# Patient Record
Sex: Female | Born: 1940 | Race: White | Hispanic: No | Marital: Married | Smoking: Never smoker
Health system: Southern US, Community
[De-identification: ages and names within clinical notes are randomized; demographics above are authoritative.]

## PROBLEM LIST (undated history)

## (undated) DIAGNOSIS — I739 Peripheral vascular disease, unspecified: Secondary | ICD-10-CM

## (undated) DIAGNOSIS — C801 Malignant (primary) neoplasm, unspecified: Secondary | ICD-10-CM

## (undated) DIAGNOSIS — E785 Hyperlipidemia, unspecified: Secondary | ICD-10-CM

## (undated) DIAGNOSIS — I639 Cerebral infarction, unspecified: Secondary | ICD-10-CM

## (undated) DIAGNOSIS — Z9889 Other specified postprocedural states: Secondary | ICD-10-CM

## (undated) DIAGNOSIS — I972 Postmastectomy lymphedema syndrome: Secondary | ICD-10-CM

## (undated) DIAGNOSIS — M1990 Unspecified osteoarthritis, unspecified site: Secondary | ICD-10-CM

## (undated) DIAGNOSIS — I10 Essential (primary) hypertension: Secondary | ICD-10-CM

## (undated) HISTORY — DX: Essential (primary) hypertension: I10

## (undated) HISTORY — DX: Cerebral infarction, unspecified: I63.9

## (undated) HISTORY — DX: Unspecified osteoarthritis, unspecified site: M19.90

## (undated) HISTORY — DX: Postmastectomy lymphedema syndrome: I97.2

## (undated) HISTORY — DX: Hyperlipidemia, unspecified: E78.5

## (undated) HISTORY — DX: Malignant (primary) neoplasm, unspecified: C80.1

## (undated) HISTORY — DX: Other specified postprocedural states: Z98.890

## (undated) HISTORY — DX: Peripheral vascular disease, unspecified: I73.9

---

## 2002-11-04 HISTORY — PX: CAROTID ENDARTERECTOMY: SUR193

## 2003-06-20 ENCOUNTER — Ambulatory Visit (HOSPITAL_COMMUNITY): Admission: RE | Admit: 2003-06-20 | Discharge: 2003-06-20 | Payer: Self-pay | Admitting: Cardiology

## 2003-06-24 ENCOUNTER — Ambulatory Visit (HOSPITAL_COMMUNITY): Admission: RE | Admit: 2003-06-24 | Discharge: 2003-06-24 | Payer: Self-pay | Admitting: Cardiology

## 2003-07-18 ENCOUNTER — Encounter (INDEPENDENT_AMBULATORY_CARE_PROVIDER_SITE_OTHER): Payer: Self-pay | Admitting: *Deleted

## 2003-07-18 ENCOUNTER — Inpatient Hospital Stay (HOSPITAL_COMMUNITY): Admission: RE | Admit: 2003-07-18 | Discharge: 2003-07-20 | Payer: Self-pay | Admitting: Vascular Surgery

## 2004-02-15 ENCOUNTER — Encounter: Admission: RE | Admit: 2004-02-15 | Discharge: 2004-02-15 | Payer: Self-pay | Admitting: Family Medicine

## 2004-02-17 ENCOUNTER — Encounter: Admission: RE | Admit: 2004-02-17 | Discharge: 2004-02-17 | Payer: Self-pay | Admitting: Family Medicine

## 2004-09-18 ENCOUNTER — Ambulatory Visit (HOSPITAL_COMMUNITY): Admission: RE | Admit: 2004-09-18 | Discharge: 2004-09-18 | Payer: Self-pay | Admitting: Gastroenterology

## 2004-09-26 ENCOUNTER — Encounter: Admission: RE | Admit: 2004-09-26 | Discharge: 2004-09-26 | Payer: Self-pay | Admitting: Family Medicine

## 2004-11-08 ENCOUNTER — Encounter: Admission: RE | Admit: 2004-11-08 | Discharge: 2004-11-08 | Payer: Self-pay | Admitting: Vascular Surgery

## 2006-06-30 ENCOUNTER — Encounter: Admission: RE | Admit: 2006-06-30 | Discharge: 2006-06-30 | Payer: Self-pay | Admitting: Family Medicine

## 2006-11-04 DIAGNOSIS — C801 Malignant (primary) neoplasm, unspecified: Secondary | ICD-10-CM

## 2006-11-04 HISTORY — DX: Malignant (primary) neoplasm, unspecified: C80.1

## 2006-12-12 ENCOUNTER — Ambulatory Visit: Payer: Self-pay | Admitting: Vascular Surgery

## 2007-07-14 ENCOUNTER — Encounter: Admission: RE | Admit: 2007-07-14 | Discharge: 2007-07-14 | Payer: Self-pay | Admitting: Family Medicine

## 2007-07-22 ENCOUNTER — Encounter: Admission: RE | Admit: 2007-07-22 | Discharge: 2007-07-22 | Payer: Self-pay | Admitting: Family Medicine

## 2007-07-29 ENCOUNTER — Encounter: Admission: RE | Admit: 2007-07-29 | Discharge: 2007-07-29 | Payer: Self-pay | Admitting: Family Medicine

## 2007-07-29 ENCOUNTER — Encounter (INDEPENDENT_AMBULATORY_CARE_PROVIDER_SITE_OTHER): Payer: Self-pay | Admitting: Radiology

## 2007-07-30 ENCOUNTER — Encounter (INDEPENDENT_AMBULATORY_CARE_PROVIDER_SITE_OTHER): Payer: Self-pay | Admitting: Radiology

## 2007-08-07 ENCOUNTER — Encounter: Admission: RE | Admit: 2007-08-07 | Discharge: 2007-08-07 | Payer: Self-pay | Admitting: Surgery

## 2007-08-11 ENCOUNTER — Ambulatory Visit (HOSPITAL_BASED_OUTPATIENT_CLINIC_OR_DEPARTMENT_OTHER): Admission: RE | Admit: 2007-08-11 | Discharge: 2007-08-12 | Payer: Self-pay | Admitting: Surgery

## 2007-08-11 ENCOUNTER — Encounter (INDEPENDENT_AMBULATORY_CARE_PROVIDER_SITE_OTHER): Payer: Self-pay | Admitting: Surgery

## 2007-08-24 ENCOUNTER — Ambulatory Visit: Payer: Self-pay | Admitting: Oncology

## 2007-09-02 LAB — CBC WITH DIFFERENTIAL/PLATELET
Basophils Absolute: 0 10*3/uL (ref 0.0–0.1)
Eosinophils Absolute: 0.2 10*3/uL (ref 0.0–0.5)
HCT: 32.9 % — ABNORMAL LOW (ref 34.8–46.6)
HGB: 11.7 g/dL (ref 11.6–15.9)
LYMPH%: 9.4 % — ABNORMAL LOW (ref 14.0–48.0)
MONO#: 0.5 10*3/uL (ref 0.1–0.9)
NEUT#: 5.7 10*3/uL (ref 1.5–6.5)
NEUT%: 79.7 % — ABNORMAL HIGH (ref 39.6–76.8)
Platelets: 252 10*3/uL (ref 145–400)
RBC: 3.78 10*6/uL (ref 3.70–5.32)
WBC: 7.2 10*3/uL (ref 3.9–10.0)

## 2007-09-06 LAB — LACTATE DEHYDROGENASE: LDH: 171 U/L (ref 94–250)

## 2007-09-06 LAB — CANCER ANTIGEN 27.29: CA 27.29: 24 U/mL (ref 0–39)

## 2007-09-06 LAB — COMPREHENSIVE METABOLIC PANEL
CO2: 26 mEq/L (ref 19–32)
Glucose, Bld: 90 mg/dL (ref 70–99)
Sodium: 142 mEq/L (ref 135–145)
Total Bilirubin: 0.4 mg/dL (ref 0.3–1.2)
Total Protein: 6.8 g/dL (ref 6.0–8.3)

## 2007-09-09 ENCOUNTER — Ambulatory Visit (HOSPITAL_COMMUNITY): Admission: RE | Admit: 2007-09-09 | Discharge: 2007-09-09 | Payer: Self-pay | Admitting: Oncology

## 2007-09-11 ENCOUNTER — Ambulatory Visit (HOSPITAL_COMMUNITY): Admission: RE | Admit: 2007-09-11 | Discharge: 2007-09-11 | Payer: Self-pay | Admitting: Oncology

## 2007-11-05 HISTORY — PX: CHOLECYSTECTOMY: SHX55

## 2008-03-16 ENCOUNTER — Ambulatory Visit: Payer: Self-pay | Admitting: Vascular Surgery

## 2008-07-26 IMAGING — PT NM PET TUM IMG SKULL BASE T - THIGH
6 series · 25 of 25 positions shown · non-contrast
Comparison: No prior PET-CT. Diagnostic CT chest abdomen and pelvis performed
earlier today correlated.

CLINICAL DATA: New diagnosis right breast cancer, status post mastectomy.
Initial staging outside the axilla.

FDG PET-CT TUMOR IMAGING (SKULL BASE TO THIGHS)  09/11/2007:
Fasting Blood Glucose:  92
TECHNIQUE: 16.7 mCi F-18 FDG was injected intravenously via the left hand vein 
Full-ring PET imaging was performed from the skull base through the mid-thighs
65 minutes after injection.  CT data was obtained and used for attenuation
correction and anatomic localization only.

[Series 1: pet ac · axial · 3.3mm · 4.69mm/px · z∈[-870,+0]mm · 5 of 267 slices shown]
[im 1/267]
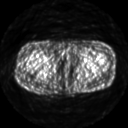
[im 67/267]
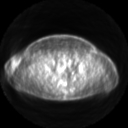
[im 134/267]
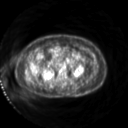
[im 200/267]
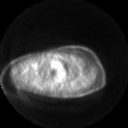
[im 267/267]
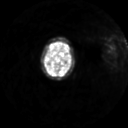

[Series 2: ct images · axial · 3.8mm · 0.98mm/px · z∈[-867,+0]mm · 6 of 264 slices shown]
[im 1/264]
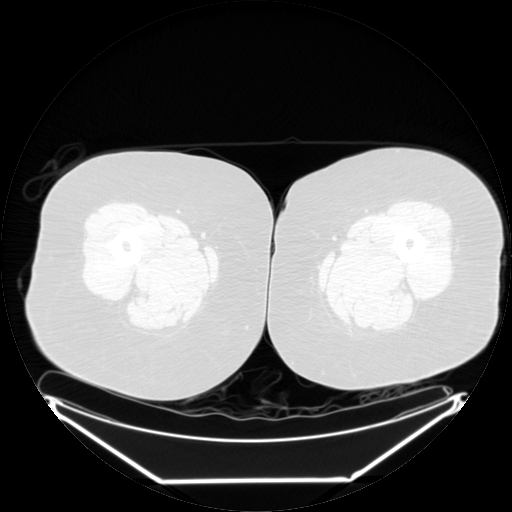
[im 53/264]
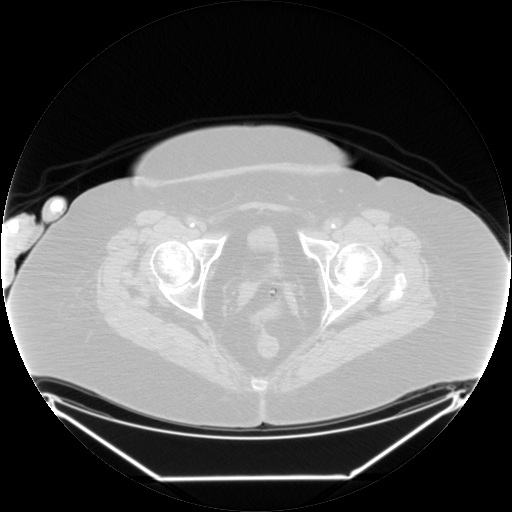
[im 106/264]
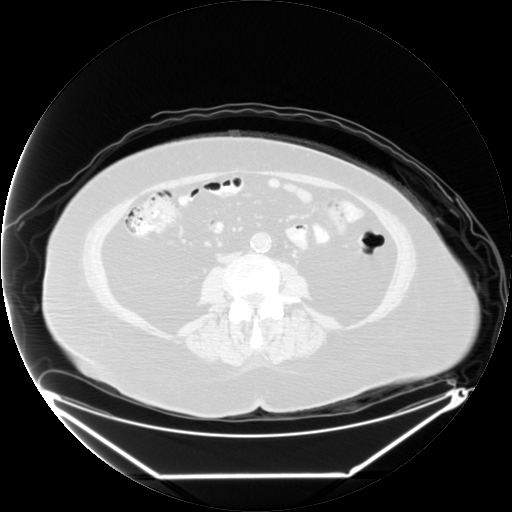
[im 158/264]
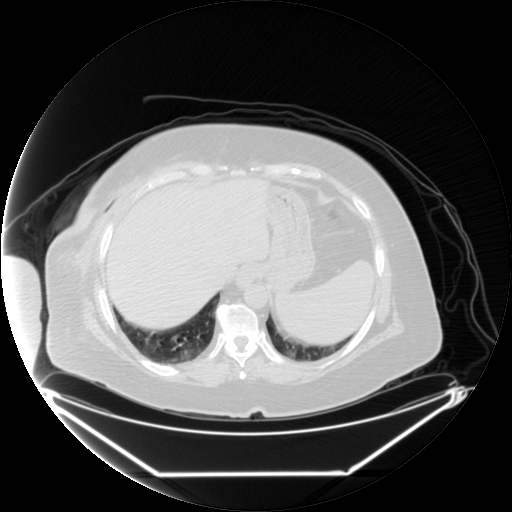
[im 211/264]
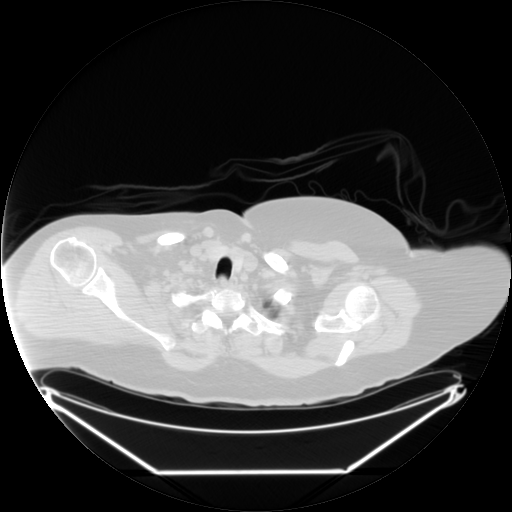
[im 264/264  brain]
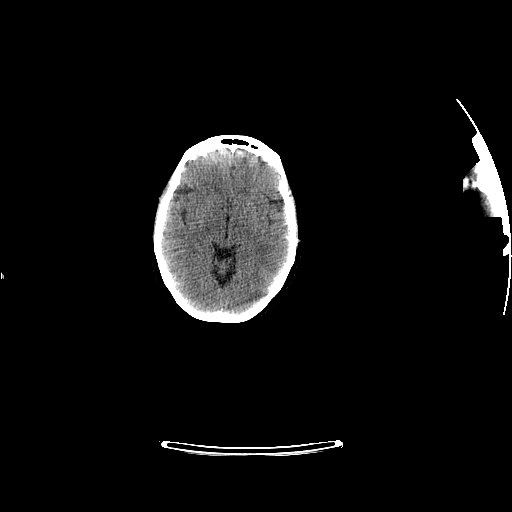

[Series 2: pet nac · axial · 3.3mm · 4.69mm/px · z∈[-870,+0]mm · 6 of 267 slices shown]
[im 1/267]
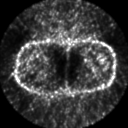
[im 54/267]
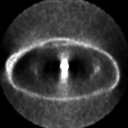
[im 107/267]
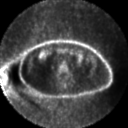
[im 160/267]
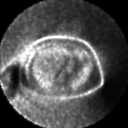
[im 213/267]
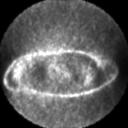
[im 267/267]
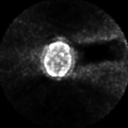

[Series 123: mip · coronal · 3.3mm · 4.69mm/px · 1 of 30 slices shown]
[im 1/30]
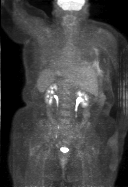

[Series 151: reformatted · axial · 3.3mm · 3.91mm/px · z∈[-857,-13]mm · 6 of 257 slices shown (1 of 2)]
[im 1/257]
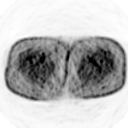
[im 52/257]
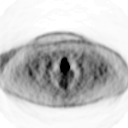
[im 103/257]
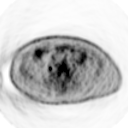
[im 154/257]
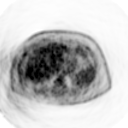
[im 205/257]
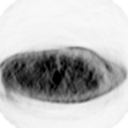
[im 257/257]
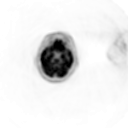

[Series 153: reformatted · coronal · 4.7mm · 6.98mm/px · 1 of 62 slices shown (2 of 2)]
[im 1/62]
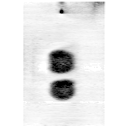

[25 of 25 positions shown; findings below may reference images not displayed]

FINDINGS: Moderate hypermetabolic activity within the right chest wall at the
site of the mastectomy, consistent with post-surgical inflammatory
change/healing. Mild post-surgical activity in the right axilla. No abnormal
metabolic activity in the neck, chest, abdomen or pelvis to suggest metastatic
disease. Normal excretion into the urinary tract, with ureteral activity noted
in both sides of the retroperitoneum. Normal bowel activity also noted.
IMPRESSION: 1. No evidence of metastatic disease in the neck, chest, abdomen or pelvis.
2. Post-surgical activity at the surgical site in the right chest wall and in
the right axilla.

## 2009-03-22 ENCOUNTER — Ambulatory Visit: Payer: Self-pay | Admitting: Vascular Surgery

## 2010-03-21 ENCOUNTER — Ambulatory Visit: Payer: Self-pay | Admitting: Vascular Surgery

## 2010-11-25 ENCOUNTER — Encounter: Payer: Self-pay | Admitting: Family Medicine

## 2011-03-19 NOTE — Op Note (Signed)
[name], [name]               ACCOUNT NO.:  [id number]   MEDICAL RECORD NO.:  [id number]          PATIENT TYPE:  AMB   LOCATION:  DSC                          FACILITY:  [facility]   PHYSICIAN:  Thomas A. Cornett, M.D.DATE OF BIRTH:  18-Jul-1941   DATE OF PROCEDURE:  08/11/2007  DATE OF DISCHARGE:                               OPERATIVE REPORT   PREOPERATIVE DIAGNOSIS:  Right breast cancer.   POSTOPERATIVE DIAGNOSIS:  Right breast cancer.   PROCEDURE:  1. Right simple mastectomy converted to right modified radical      mastectomy.  2. Right sentinel lymph node mapping with a positive sentinel lymph      node with right axillary node dissection after injection of      methylene blue dye.   SURGEON:  [name], M.D.   ASSISTANTSharlet Salina T. Hoxworth, M.D.   ANESTHESIA:  LMA with 10 mL of 0.25% Sensorcaine.   SPECIMEN:  Right breast and right axillary contents to pathology.  Sentinel nodes were positive by touch prep.   DRAINS:  Two 19 Blake drains.   INDICATIONS FOR PROCEDURE:  The patient is a 70 year old post menopausal  female found to have a large area in her right breast.  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  MRI showed a very large 10 cm region which was  probably a combination of both.  By discussion, she does not wish to  undergo any breast conserving measures and wished to have a mastectomy  at this point.  She is brought to the operating today for that after  informed consent has been discussed and other options with her, as well,  preoperatively.  She underwent injection of technetium sulfur colloid  preoperatively in the right breast.   DESCRIPTION OF PROCEDURE:  The patient was brought to the operating room  after injection of the technetium sulfur colloid in the right breast by  radiology.  After induction of LMA anesthesia, the right breast and  right axilla were prepped and  draped in a sterile fashion.  A  curvilinear incision was made above and below the nipple.  Superior skin  flap was raised all the way to the clavicle.  We then used a NeoProbe  and we were able to dissect in the axilla and find two sentinel nodes  which were both blue and hot.  These came back to be positive by touch  prep.  After the superior flap was fashioned, the inferior flap was then  created taking it just below the inferior mammary fold.  After both  flaps were constructed, the breast was removed in a medial to lateral  fashion.  Once we got to the lateral border of the pectoralis major, we  entered the axilla.  We then used clips to control small vessels and  lymphatics.  The axillary vein was identified.  The large draining vein  from the axillary contents to the axillary vein was controlled with two  clips.  I found the thoracodorsal trunk and the long  thoracic nerves  respectively and preserved these.  The median pectoral nerves were also  identified and preserved.  The contents were then removed with the right  mastectomy specimen in situ.  The specimen was oriented and sent to  pathology for evaluation.   Hemostasis was achieved.  I secured the flaps in the chest wall using 3-  0 Vicryl.  Through two separate stab incisions, 19 Blake drains were  placed, one up under the flaps and the other in the right axilla.  I  used small piece of Surgicel around the axillary vein to control some  oozing with excellent result.  After the flaps were secured to the chest  wall, I approximated the skin with 3-0 Vicryl.  4-0 Monocryl was then  used to close the skin in a subcuticular fashion.  Dermabond was applied  to the incision.  The suction balls were suctioned down without leak.  3-  0 nylon kept the drains in place.  Drain sponges were then placed.  All  final counts of sponge, needle and instruments were found to be correct  for this portion of the case.  The patient was awakened  and taken to  recovery in satisfactory condition.  4 mL of methylene blue dye were  injected in the subareolar position after sterile prep and drape for  sentinel lymph node mapping.  All final counts were counted a second  time and found to be correct.  The patient was awakened and taken to  recovery in satisfactory condition.      Thomas A. Cornett, M.D.  Electronically Signed     TAC/MEDQ  D:  08/11/2007  T:  08/11/2007  Job:  [id number]   cc:   Pam Drown, M.D.

## 2011-03-19 NOTE — Procedures (Signed)
CAROTID DUPLEX EXAM   INDICATION:  Carotid artery disease.   HISTORY:  Diabetes:  No.  Cardiac:  No.  Hypertension:  Yes.  Smoking:  No.  Previous Surgery:  Left CEA on 07/18/2003.  CV History:  History of a TIA.  Amaurosis Fugax No, Paresthesias No, Hemiparesis No.                                       RIGHT             LEFT  Brachial systolic pressure:         Mastectomy        Mastectomy  Brachial Doppler waveforms:         Triphasic         Triphasic  Vertebral direction of flow:        Ante              Ante  DUPLEX VELOCITIES (cm/sec)  CCA peak systolic                   98                113  ECA peak systolic                   86                89  ICA peak systolic                   101               127  ICA end diastolic                   42                27  PLAQUE MORPHOLOGY:                  Mixed             Mixed  PLAQUE AMOUNT:                      Mild              Mild  PLAQUE LOCATION:                    ICA               ICA   IMPRESSION:  1. 1% to 39% stenosis of the right internal carotid.  2. Patent left carotid endarterectomy site with Doppler velocities      suggestive of 40% to 59% stenosis of the left internal carotid      artery.  3. Bilateral antegrade flow in the vertebral arteries.       ___________________________________________  Janetta Hora Fields, MD   NT/MEDQ  D:  03/21/2010  T:  03/21/2010  Job:  [id number]

## 2011-03-19 NOTE — Procedures (Signed)
CAROTID DUPLEX EXAM   INDICATION:  Follow-up evaluation of known carotid artery disease.   HISTORY:  Diabetes:  No.  Cardiac:  No.  Hypertension:  Yes.  Smoking:  No.  Previous Surgery:  Left carotid endarterectomy with Dacron patch  angioplasty on 07/18/03 by Dr. Darrick Penna.  CV History:  Patient reports no cerebrovascular symptoms at this time.  Previous duplex performed on 12/12/06 revealed 20-39% ICA stenosis  bilaterally.  Amaurosis Fugax No, Paresthesias No, Hemiparesis No                                       RIGHT             LEFT  Brachial systolic pressure:         Not obtained      Not obtained  Brachial Doppler waveforms:         Triphasic         Triphasic  Vertebral direction of flow:        Antegrade         Antegrade  DUPLEX VELOCITIES (cm/sec)  CCA peak systolic                   125               109  ECA peak systolic                   153               149  ICA peak systolic                   101               124  ICA end diastolic                   16                39  PLAQUE MORPHOLOGY:                  Soft              Soft  PLAQUE AMOUNT:                      Mild              Mild  PLAQUE LOCATION:                    Proximal ICA      Proximal ICA   IMPRESSION:  1. Brachial pressures were not obtained today secondary to recent      mastectomy.  2. 20-39% right internal carotid artery stenosis.  3. 20-39% left internal carotid artery stenosis, status post      endarterectomy.  4. No significant change since previous study performed on 12/12/06.   ___________________________________________  Janetta Hora. Fields, MD   MC/MEDQ  D:  03/16/2008  T:  03/16/2008  Job:  [id number]

## 2011-03-22 NOTE — Cardiovascular Report (Signed)
[name], [name]                           ACCOUNT NO.:  [id number]   MEDICAL RECORD NO.:  [id number]                   PATIENT TYPE:  OIB   LOCATION:  2899                                 FACILITY:  [facility]   PHYSICIAN:  Armanda Magic, M.D.                  DATE OF BIRTH:  Mar 24, 1941   DATE OF PROCEDURE:  06/20/2003  DATE OF DISCHARGE:                              CARDIAC CATHETERIZATION   REFERRING PHYSICIAN:  Pam Drown, M.D.   PROCEDURES:  1. Left heart catheterization.  2. Coronary angiography.  3. Left ventriculography.  4. Intracoronary nitroglycerin injection of the right coronary artery.   OPERATOR:  Armanda Magic, M.D.   INDICATIONS:  Neck pain and abnormal Cardiolite.   COMPLICATIONS:  None.   INTRAVENOUS ACCESS:  Via right femoral artery, 6 French sheath.   This is a pleasant 70 year old obese white female who recently lost 15  pounds and started developing episodes of hypotension.  During her episodes  of hypotension she would develop severe left neck pain going up into her  jaw, radiation to her left shoulder.  She presented to my office with  nonspecific ST abnormalities in the inferior leads.  Stress Cardiolite study  showed an apical perfusion defect with some decreased perfusion in the  inferior wall with stress with redistribution on resting images, consistent  with possible underlying ischemia, normal LV systolic function.   The patient is brought to the cardiac catheterization laboratory in the  fasting, nonsedated state.  Informed consent was obtained.  The patient was  connected to continuous heart rate and pulse oximetry monitoring,  intermittent blood pressure monitoring.  The right groin was prepped and  draped in a sterile fashion.  Xylocaine 1% was used for local anesthesia.  Using modified Seldinger technique a 6 French sheath was placed in the right  femoral artery.  Under fluoroscopic guidance a 6 [place] JL4 catheter was  placed in the  left coronary artery.  Multiple cine films were taken in the  30 degree RAO and 40 degree LAO views.  This catheter was then exchanged out  over a guidewire for a 6 [place] JR4 catheter, which was placed under  fluoroscopic guidance into the right coronary artery.  There was damping  noted on the pressure tracing on engagement of the right coronary ostium.  Cine films were taken in the 40 degree LAO view with a cranial angulation.  It appeared as though the catheter tip was up against the sidewall right  before engaging into the right coronary artery ostium.  It was unsure as to  whether there was a lesion there or if it was due to the catheter not being  adequately engaged.  Intracoronary nitroglycerin 200 mcg were given without  any significant improvement.  The 6 French catheter was changed out for a 5  [place] JR4 catheter, which was then placed in the right coronary  artery  ostium.  Cine pictures were taken in the 30 degree RAO and 40 degree LAO  views.  Again there did appear to be some damping of the catheter on  engagement of the ostium, but again this catheter also seemed somewhat large  for the size of the right coronary artery, which is a nondominant artery.  The catheter was then exchanged out over a guidewire for a 6 French angled  pigtail catheter, which was placed under fluoroscopic guidance in the left  ventricular cavity.  Left ventriculography was performed in a 30 degree RAO  view using a total of 30 mL of contrast  at 13 mL/sec.  The catheter was  then pulled back across the aortic valve with no significant gradient.  At  the end of the procedure all catheters and sheaths were removed.  Manual  compression was performed until adequate hemostasis was obtained.  The  patient was then transferred back in the room in stable condition.   RESULTS:  1. The left main coronary artery is widely patent and bifurcates into the     left anterior descending artery and left  circumflex.  2. The left anterior descending artery is calcified proximally with a 20%     proximal narrowing.  It then gives rise to a large long, thin diagonal     and then a second diagonal.  There are luminal irregularities throughout     the LAD, which traverses to the apex.  3. The left circumflex gives rise to a high obtuse marginal branch, which is     a small-caliber vessel with no apparent obstructive disease.  The left     circumflex artery has luminal irregularities throughout its course.  It     gives rise to  second obtuse marginal branch, which is a larger-caliber     vessel, which bifurcates into two daughter branches, both of which are     widely patent.  The ongoing left circumflex then gives rise distally to     the posterior descending artery, which is widely patent.  4. The right coronary artery is a nondominant artery and appears widely     patent throughout its course and bifurcates distally into two small     branches, which are widely patent.  Again, on engagement of the right     coronary ostium there was significant damping, which appeared that the     catheter was up against the sidewall and not adequately engaged in the     right coronary artery ostium and even with a 5 French catheter, there was     still some damping but did not appear to be an obstructive lesion at the     ostium.  5. Left ventriculography performed in the 30 degree RAO view using a total     of 30 mL of contast at 13 mL/sec. showed normal LV systolic function, LV     pressure 128/16 mmHg, aortic pressure 132/66 mmHg.   ASSESSMENT:  1. Noncardiac neck pain.  2. Normal coronary arteries.  3. Normal left ventricular function.   PLAN:  1. IV fluid hydration.  2. Discharge to home.  3. Follow up with me in two weeks.                                               Traci  Mayford Knife, M.D.    TT/MEDQ  D:  06/20/2003  T:  06/20/2003  Job:  [id number]  cc:   Pam Drown, M.D.  [address]  [place]  [place] [address]  Fax: [phone]

## 2011-03-22 NOTE — Procedures (Signed)
CAROTID DUPLEX EXAM   INDICATION:  Carotid artery disease.   HISTORY:  Diabetes:  No.  Cardiac:  No.  Hypertension:  Yes.  Smoking:  No.  Previous Surgery:  Left carotid endarterectomy on 07/18/03.  History of  mastectomy.  CV History:  Currently asymptomatic.  Amaurosis Fugax No, Paresthesias No, Hemiparesis No.                                       RIGHT             LEFT  Brachial systolic pressure:  Brachial Doppler waveforms:         Within normal limits                Within normal limits  Vertebral direction of flow:        Antegrade         Antegrade  DUPLEX VELOCITIES (cm/sec)  CCA peak systolic                   121               103  ECA peak systolic                   86                81  ICA peak systolic                   77                129  ICA end diastolic                   31                48  PLAQUE MORPHOLOGY:                  Mixed             Mixed  PLAQUE AMOUNT:                      Mild              Mild  PLAQUE LOCATION:                    ICA               ICA   IMPRESSION:  1. 1-39% stenosis of the right internal carotid artery.  2. Patent left carotid endarterectomy site with Doppler velocities      suggestive of 40-59% stenosis of the left internal carotid artery.  3. No significant change in Doppler velocities noted when compared to      the previous examination on 03/16/08.   ___________________________________________  Janetta Hora. Fields, MD   CH/MEDQ  D:  03/23/2009  T:  03/23/2009  Job:  [id number]

## 2011-03-22 NOTE — Op Note (Signed)
[name], [name]               ACCOUNT NO.:  [id number]   MEDICAL RECORD NO.:  [id number]          PATIENT TYPE:  AMB   LOCATION:  ENDO                         FACILITY:  [facility]   PHYSICIAN:  James L. Malon Kindle., M.D.DATE OF BIRTH:  [birth year]   DATE OF PROCEDURE:  09/18/2004  DATE OF DISCHARGE:                                 OPERATIVE REPORT   PROCEDURE PERFORMED:  Colonoscopy.   ENDOSCOPIST:  Llana Aliment. Edwards, M.D.   MEDICATIONS:  Fentanyl 80 mcg, Versed 10 mg IV.   INSTRUMENT USED:  Olympus adjustable colonoscope.   INDICATIONS FOR PROCEDURE:  Heme positive stool in a woman with strong  family history of colon cancer.  Mother died of colon cancer.   DESCRIPTION OF PROCEDURE:  The procedure had been explained to the patient  and consent obtained.  With the patient in the left lateral decubitus  position, the Olympus colonoscope was inserted and advanced under direct  visualization. The prep was quite good.  There was some sticky adherent  stool occasionally but overall, the mucosa was quite clear.  We were able to  reach the cecum using abdominal pressure and position changes.  The  ileocecal valve and appendiceal orifice were seen.  The scope was withdrawn  and the cecum, ascending colon, transverse colon, descending and sigmoid  colon were seen well upon removal.  No polyps or other lesions were seen.  The scope was withdrawn down to the rectum.  The rectum was free of polyps.  The patient tolerated the procedure well, was maintained on low-flow oxygen  and pulse oximeter throughout the procedure.   ASSESSMENT:  1.  Heme positive stool.  [id number].  2.  Strong family history of colon cancer in a parent. [id number].   PLAN:  Will recommend repeating colonoscopy in five years.  Will see back in  the office in six to eight weeks to recheck her stool.       JLE/MEDQ  D:  09/18/2004  T:  09/18/2004  Job:  [id number]   cc:   Pam Drown, M.D.  [address]  [place]  [place] [address]  Fax: [phone]

## 2011-03-22 NOTE — Op Note (Signed)
[name], [name]                           ACCOUNT NO.:  [id number]   MEDICAL RECORD NO.:  [id number]                   PATIENT TYPE:  INP   LOCATION:  2860                                 FACILITY:  [facility]   PHYSICIAN:  Janetta Hora. Fields, MD               DATE OF BIRTH:  [birth year]   DATE OF PROCEDURE:  07/18/2003  DATE OF DISCHARGE:                                 OPERATIVE REPORT   PREOPERATIVE DIAGNOSIS:  Asymptomatic carotid stenosis.   POSTOPERATIVE DIAGNOSIS:  Asymptomatic carotid stenosis.   OPERATION PERFORMED:  Left carotid endarterectomy.   SURGEON:  Janetta Hora. Fields, MD   ANESTHESIA:  General.   INDICATIONS FOR PROCEDURE:  The patient is a 70 year old female with a  greater than 80% stenosis found on duplex ultrasound during work-up for  coronary artery disease.  She has been asymptomatic.   OPERATIVE FINDINGS:  1. Greater than 80% stenosis of left internal carotid artery, focal.  2. High bifurcation of carotid.  3. Dacron patch.  4. 10 French shunt.   DESCRIPTION OF PROCEDURE:  After obtaining informed consent, the patient was  taken to the operating room.  The patient was informed of the risks,  benefits and possible complications of carotid endarterectomy including but  not limited to stroke, cranial nerve injury, bleeding and myocardial  infarction.   After induction of general anesthesia and endotracheal intubations, the  patient's entire neck and chest were prepped and draped in the usual sterile  fashion.  Next, an incision was made along the anterior border of the left  sternocleidomastoid muscle.  This incision was carried down through the  subcutaneous tissues, down through the platysma.  The sternocleidomastoid  muscle was retracted laterally and the common facial vein was dissected free  circumferentially and divided between silk ties.  Next, the common carotid  artery was dissected free circumferentially. The vagus nerve was identified  and  protected from harm's way.  The ansa cervicalis was identified and cut  due to its overlying the main portion of the carotid bifurcation.  Dissection then proceeded up the carotid artery and the hypoglossal nerve  was identified and protected from harm's way.  The superior thyroid artery,  external carotid arteries were identified and dissected free  circumferentially.  These were both controlled with vessel loops.  Next, the  internal carotid artery was dissected free circumferentially after  administration of 5000 units of intravenous heparin.  The patient had a high  bifurcation and a short neck so the internal carotid artery was deep and  some mobilization of the hypoglossal nerve was required.  Two small venous  branches around the hypoglossal nerve were divided between silk ties to  immobilize the nerve to expose the distal internal carotid artery.  Next an  umbilical tape was placed around the distal internal carotid artery and  another umbilical tape was placed around the common carotid artery.  Both of  these were to be used as Rumel tourniquets.  Next, the internal carotid  artery was clamped with a Seraphim clamp. The external and superior thyroid  arteries were controlled with vessel loops.  The common carotid artery was  then clamped with  Fogarty Hydragrip clamp.  Next, an arteriotomy was made  in the common carotid artery and this was continued up into the internal  carotid artery with Potts scissors.  The stenosis was so tight that it would  barely transmit the Potts scissors.  The arteriotomy was extended up onto  the internal carotid artery to an area of nondiseased internal carotid  artery.  Next, the plaque was thoroughly cleaned out removing all small  debris and remnants. This was thoroughly flushed with heparinized saline.  Next, a Dacron patch was brought up in the operative field and this was sewn  on as a patch angioplasty using a running 6-0 Prolene suture.  Prior  to  starting a plaque removal, actually a 10 [place] shunt was placed up into the  internal carotid artery and this was controlled with a Rumel tourniquet.  This was allowed to back bleed.  The proximal end of the shunt was then  placed into the common carotid artery. This was inspected for air bubbles  and when no air bubbles were present, the shunt was opened to the internal  carotid artery.  Approximate time for shunt placement was six minutes.   The internal carotid artery was carefully inspected and a good end point was  obtained without flaps.  After the near completion of the patch angioplasty,  the shunt was removed first from the internal carotid artery and this was  allowed to back bleed.  The shunt was then removed from the common carotid  artery.  These were then controlled again with a Seraphim clamp and a  Fogarty Hydragrip clamp.  The artery was thoroughly flushed with heparinized  saline and a patch was completed.  Next the external carotid artery control  was released and a common carotid clamp was removed and the artery allowed  to flush up the external carotid artery for approximately five beats.  The  internal carotid artery was then unclamped.  Total time of clamping after  shunt removal was approximately five minutes.  At this point hemostasis was  obtained.  A Doppler probe was then used to inspect the common, external and  internal carotid arteries and these were all noted to have biphasic and good  characteristic flow.  The neck was thoroughly irrigated with saline  solution.  The platysma and subcutaneous tissues were then reapproximated  using a running Vicryl stitch.  The skin was then closed with a running  Vicryl subcuticular stitch.  The patient tolerated the procedure well.  There were no complications.  Sponge, needle and instrument counts were  correct at the end of the case.  The patient was awakened in the operating room and noted to have symmetric upper  extremity and lower extremity motor  exam.  The patient was transferred to the recovery room in stable condition.                                               Janetta Hora. Fields, MD    CEF/MEDQ  D:  07/18/2003  T:  07/18/2003  Job:  [id number]

## 2011-03-28 ENCOUNTER — Other Ambulatory Visit (INDEPENDENT_AMBULATORY_CARE_PROVIDER_SITE_OTHER): Payer: Medicare Other

## 2011-03-28 ENCOUNTER — Ambulatory Visit (INDEPENDENT_AMBULATORY_CARE_PROVIDER_SITE_OTHER): Payer: Medicare Other | Admitting: Vascular Surgery

## 2011-03-28 DIAGNOSIS — I6529 Occlusion and stenosis of unspecified carotid artery: Secondary | ICD-10-CM

## 2011-03-28 DIAGNOSIS — Z48812 Encounter for surgical aftercare following surgery on the circulatory system: Secondary | ICD-10-CM

## 2011-03-29 NOTE — Assessment & Plan Note (Signed)
OFFICE VISIT  [name], [name] DOB:  [birth year]                                       03/28/2011 XBJYN#:[id number]  CHIEF COMPLAINT:  Carotid stenosis followup.  HISTORY OF PRESENT ILLNESS:  The patient is a 70 year old female who previously underwent left carotid endarterectomy in 2004.  She has been followed with intermittent surveillance duplex scan since then.  She denies any symptoms of TIA, amaurosis or stroke.  CHRONIC MEDICAL PROBLEMS:  Include elevated cholesterol, hypertension which are currently controlled and followed by Dr. Lenis Dickinson.  She also has stage IV breast cancer with lesions in her right-sided ribs.  She also has some right lymphedema from previous mastectomy.  All of these symptoms are currently controlled and followed by Dr. Victorio Palm.  MEDICATIONS:  The patient is currently on Plavix and aspirin for stroke prophylaxis.  REVIEW OF SYSTEMS:  The patient has had some recent weight loss.  She is currently 5 feet 7 inches, 190 pounds. GI:  She has occasional constipation. URINARY:  She has some frequency. MUSCULOSKELETAL:  She has multiple joint and arthritis pain.  She also has easy fatigue related to her chemotherapy. Cardiac, neurologic, pulmonary, hematologic, psychiatric, ENT and skin review of systems are all negative.  FAMILY HISTORY:  Remarkable for her father and brothers who had vascular disease at age less than 16.  SOCIAL HISTORY:  She is married.  She has 2 children.  She is a nonsmoker, nonconsumer of alcohol.  PHYSICAL EXAM:  Vital signs:  Blood pressure is 108/71 in the left arm, heart rate 69 and regular, respirations 20.  HEENT:  Unremarkable. Neck:  Left neck scar is well-healed.  She has no carotid bruits. Chest:  Clear to auscultation.  Cardiac:  Regular rate and rhythm without murmur.  Abdomen:  Obese, soft, nontender, nondistended.  No masses.  Musculoskeletal:  Exam shows no obvious major  joint deformities.  Neurologic:  Shows symmetric upper extremity and lower extremity motor strength with no pronator drift.  She has no sensory deficits.  Skin:  Has no open ulcers or rashes.  She had a carotid duplex exam today which showed no restenosis on the left side.  She has no significant stenosis on the right side.  From a carotid standpoint the patient is doing well.  She has had no recurrent stenosis since her carotid endarterectomy in 2004.  She will continue with intermittent carotid duplex surveillance and continue her antiplatelet therapy.  I did tell her today that I think that it is okay for her to discontinue her Plavix at this point since she really has no significant carotid stenosis and her atherosclerotic risk factors are fairly well-controlled, aspirin alone should be enough.  She will follow up with [place] in one year's time for carotid duplex scan.    Janetta Hora. Cuong Moorman, MD Electronically Signed  CEF/MEDQ  D:  03/28/2011  T:  03/29/2011  Job:  [id number]  cc:   Dr Lenis Dickinson Dr Victorio Palm

## 2011-04-04 NOTE — Procedures (Unsigned)
CAROTID DUPLEX EXAM  INDICATION:  Follow up carotid artery disease.  HISTORY: Diabetes:  No. Cardiac:  No. Hypertension:  Yes. Smoking:  No. Previous Surgery:  Left carotid endarterectomy on 07/18/2003. CV History:  Patient is currently asymptomatic. Amaurosis Fugax No, Paresthesias No, Hemiparesis No.                                      RIGHT             LEFT Brachial systolic pressure:         Patient has had bilateral mastectomies                        Patient has had bilateral mastectomies Brachial Doppler waveforms:         WNL               WNL Vertebral direction of flow:        Antegrade         Antegrade DUPLEX VELOCITIES (cm/sec) CCA peak systolic                   90                94 ECA peak systolic                   85                75 ICA peak systolic                   83                74 ICA end diastolic                   30                28 PLAQUE MORPHOLOGY:                  Heterogenous PLAQUE AMOUNT:                      Moderate PLAQUE LOCATION:                    ICA, ECA  IMPRESSION: 1. Patent and durable left carotid endarterectomy with no     hemodynamically significant stenosis. 2. Right side, no hemodynamically significant stenosis. 3. Study is stable compared to previous.  ___________________________________________ Janetta Hora Fields, MD  OD/MEDQ  D:  03/28/2011  T:  03/28/2011  Job:  [id number]

## 2011-08-15 LAB — BASIC METABOLIC PANEL
CO2: 28
Chloride: 106
Creatinine, Ser: 0.85
GFR calc Af Amer: >60

## 2011-08-15 LAB — DIFFERENTIAL
Basophils Relative: 0
Eosinophils Absolute: 0.2
Eosinophils Relative: 5
Monocytes Absolute: 0.5
Monocytes Relative: 10
Neutrophils Relative %: 68

## 2011-08-15 LAB — CBC
HCT: 37.7
MCHC: 34.2
MCV: 88.3
RBC: 4.27

## 2011-12-12 ENCOUNTER — Ambulatory Visit: Payer: Self-pay | Admitting: Vascular Surgery

## 2011-12-12 ENCOUNTER — Other Ambulatory Visit: Payer: Self-pay

## 2012-03-24 ENCOUNTER — Telehealth: Payer: Self-pay | Admitting: Vascular Surgery

## 2012-03-24 NOTE — Telephone Encounter (Signed)
Canceled appointment as per staff message. Pt will call back to R/S. Did not return phone call, as per message, pt will call back.

## 2012-03-24 NOTE — Telephone Encounter (Signed)
Message copied by Fredrich Birks on Tue Mar 24, 2012 10:58 AM ------      Message from: Marcellus Scott      Created: Tue Mar 24, 2012 10:25 AM       [name] needs to cancel her appointment for Thursday. She is having an MRI done this day. She wants to call back and reschedule.            Thanks       Revonda Standard

## 2012-03-26 ENCOUNTER — Other Ambulatory Visit: Payer: Medicare Other

## 2012-03-27 ENCOUNTER — Encounter: Payer: Self-pay | Admitting: Neurosurgery

## 2012-04-08 ENCOUNTER — Encounter: Payer: Self-pay | Admitting: Neurosurgery

## 2012-04-09 ENCOUNTER — Ambulatory Visit (INDEPENDENT_AMBULATORY_CARE_PROVIDER_SITE_OTHER): Payer: Medicare Other | Admitting: Neurosurgery

## 2012-04-09 ENCOUNTER — Encounter: Payer: Self-pay | Admitting: Neurosurgery

## 2012-04-09 ENCOUNTER — Ambulatory Visit (INDEPENDENT_AMBULATORY_CARE_PROVIDER_SITE_OTHER): Payer: Medicare Other | Admitting: *Deleted

## 2012-04-09 VITALS — BP 160/81 | HR 69 | Resp 16 | Ht 67.0 in | Wt 209.6 lb

## 2012-04-09 DIAGNOSIS — Z48812 Encounter for surgical aftercare following surgery on the circulatory system: Secondary | ICD-10-CM

## 2012-04-09 DIAGNOSIS — I6529 Occlusion and stenosis of unspecified carotid artery: Secondary | ICD-10-CM

## 2012-04-09 NOTE — Progress Notes (Signed)
[facility] HISTORY AND PHYSICAL   CC: Annual carotid duplex Referring Physician: Fields  History of Present Illness: 71 year old female patient of Dr. Darrick Penna that is 9 years status post left CEA. Patient reports no signs or symptoms of CVA, TIA, amaurosis fugax or any neural deficit. Patient also denies any new medical diagnoses or recent surgeries.  Past Medical History  Diagnosis Date  . Hypertension   . Hyperlipidemia   . Stroke   . Arthritis   . S/P breast lumpectomy   . Peripheral vascular disease   . Lymphedema syndrome, postmastectomy     Right upper extremity  . Cancer oct. 2011    Bone cancer  . Cancer 2008    Breast cancer    ROS: [x]  Positive   [ ]  Denies    General: [ ]  Weight loss, [ ]  Fever, [ ]  chills Neurologic: [ ]  Dizziness, [ ]  Blackouts, [ ]  Seizure [ ]  Stroke, [ ]  "Mini stroke", [ ]  Slurred speech, [ ]  Temporary blindness; [ ]  weakness in arms or legs, [ ]  Hoarseness Cardiac: [ ]  Chest pain/pressure, [ ]  Shortness of breath at rest [ ]  Shortness of breath with exertion, [ ]  Atrial fibrillation or irregular heartbeat Vascular: [ ]  Pain in legs with walking, [ ]  Pain in legs at rest, [ ]  Pain in legs at night,  [ ]  Non-healing ulcer, [ ]  Blood clot in vein/DVT,   Pulmonary: [ ]  Home oxygen, [ ]  Productive cough, [ ]  Coughing up blood, [ ]  Asthma,  [ ]  Wheezing Musculoskeletal:  [ ]  Arthritis, [ ]  Low back pain, [ ]  Joint pain Hematologic: [ ]  Easy Bruising, [ ]  Anemia; [ ]  Hepatitis Gastrointestinal: [ ]  Blood in stool, [ ]  Gastroesophageal Reflux/heartburn, [ ]  Trouble swallowing Urinary: [ ]  chronic Kidney disease, [ ]  on HD - [ ]  MWF or [ ]  TTHS, [ ]  Burning with urination, [ ]  Difficulty urinating Skin: [ ]  Rashes, [ ]  Wounds Psychological: [ ]  Anxiety, [ ]  Depression   Social History History  Substance Use Topics  . Smoking status: Never Smoker   . Smokeless tobacco: Not on file  . Alcohol Use: No    Family  History Family History  Problem Relation Age of Onset  . Heart disease Father     prior to age 75  . Heart disease Brother     prior to age 74    Allergies  Allergen Reactions  . Penicillins     Current Outpatient Prescriptions  Medication Sig Dispense Refill  . aspirin 81 MG tablet Take 81 mg by mouth daily.      . Calcium Carbonate (CALTRATE 600 PO) Take by mouth.      . cholecalciferol (VITAMIN D) 400 UNITS TABS Take by mouth daily.      . clopidogrel (PLAVIX) 75 MG tablet Take 75 mg by mouth daily.      . fish oil-omega-3 fatty acids 1000 MG capsule Take 1 g by mouth daily.      . hydrochlorothiazide (HYDRODIURIL) 25 MG tablet Take 25 mg by mouth daily.      Marland Kitchen HYDROcodone-acetaminophen (LORTAB) 7.5-500 MG per tablet Take 1 tablet by mouth daily.      . pantoprazole (PROTONIX) 40 MG tablet Take 40 mg by mouth daily.      . rosuvastatin (CRESTOR) 20 MG tablet Take 20 mg by mouth daily.        Physical Examination  Filed Vitals:  04/09/12 1458  BP: 160/81  Pulse: 69  Resp: 16    Body mass index is 32.83 kg/(m^2).  General:  WDWN in NAD Gait: Normal HEENT: WNL Eyes: Pupils equal Pulmonary: normal non-labored breathing , without Rales, rhonchi,  wheezing Cardiac: RRR, without  Murmurs, rubs or gallops; Abdomen: soft, NT, no masses Skin: no rashes, ulcers noted  Vascular Exam Pulses: 2+ radial pulses bilaterally Carotid bruits: Carotid pulses to auscultation bilaterally no bruits are heard Extremities without ischemic changes, no Gangrene , no cellulitis; no open wounds;  Musculoskeletal: no muscle wasting or atrophy   Neurologic: A&O X 3; Appropriate Affect ; SENSATION: normal; MOTOR FUNCTION:  moving all extremities equally. Speech is fluent/normal  Non-Invasive Vascular Imaging CAROTID DUPLEX 04/09/2012  Right ICA 0 - 19% stenosis Left ICA 0 - 19% stenosis   ASSESSMENT/PLAN: Asymptomatic patient status post a left CEA in 2004. Minimal right stenosis. The  patient return in one year for repeat carotid duplex, her questions were encouraged and answered. Patient knows signs and symptoms of CVA and noticed report to the nearest emergency room should that occur.  Lauree Chandler ANP   Clinic MD: Darrick Penna

## 2012-04-10 NOTE — Progress Notes (Signed)
Addended by: Sharee Pimple on: 04/10/2012 08:46 AM   Modules accepted: Orders

## 2012-04-17 NOTE — Procedures (Unsigned)
CAROTID DUPLEX EXAM  INDICATION:  Follow up carotid artery disease.  HISTORY: Breast cancer, right mastectomy; bone cancer. Diabetes:  No. Cardiac:  No. Hypertension:  No. Smoking:  No. Previous Surgery:  Left carotid endarterectomy 07/18/2003 by Dr. Darrick Penna. CV History:  Asymptomatic. Amaurosis Fugax No, Paresthesias No, Hemiparesis No                                      RIGHT             LEFT Brachial systolic pressure:         Mastectomy        142 Brachial Doppler waveforms:         Triphasic         Triphasic Vertebral direction of flow:        Antegrade         Antegrade DUPLEX VELOCITIES (cm/sec) CCA peak systolic                   143               91 ECA peak systolic                   56                74 ICA peak systolic                   104 (distal)      104 (mid) ICA end diastolic                   42                42 PLAQUE MORPHOLOGY:                  Heterogeneous     Soft PLAQUE AMOUNT:                      Moderate          Minimal PLAQUE LOCATION:                    Bifurcation/ICA   ICA  IMPRESSION: 1. Right:  No hemodynamically significant internal carotid artery     stenosis. 2. Left:  Patent carotid endarterectomy site with no evidence of re-     stenosis. 3. Vertebral artery flow antegrade bilaterally.     ___________________________________________ Janetta Hora Fields, MD  SS/MEDQ  D:  04/09/2012  T:  04/09/2012  Job:  [id number]

## 2013-04-15 ENCOUNTER — Other Ambulatory Visit: Payer: Self-pay | Admitting: *Deleted

## 2013-04-15 ENCOUNTER — Other Ambulatory Visit (INDEPENDENT_AMBULATORY_CARE_PROVIDER_SITE_OTHER): Payer: Medicare Other | Admitting: Vascular Surgery

## 2013-04-15 ENCOUNTER — Ambulatory Visit: Payer: Medicare Other | Admitting: Neurosurgery

## 2013-04-15 DIAGNOSIS — Z48812 Encounter for surgical aftercare following surgery on the circulatory system: Secondary | ICD-10-CM

## 2013-04-15 DIAGNOSIS — I6529 Occlusion and stenosis of unspecified carotid artery: Secondary | ICD-10-CM

## 2013-04-16 ENCOUNTER — Encounter: Payer: Self-pay | Admitting: Vascular Surgery

## 2013-05-27 ENCOUNTER — Ambulatory Visit: Payer: Medicare Other | Admitting: Vascular Surgery

## 2013-12-16 ENCOUNTER — Other Ambulatory Visit: Payer: Self-pay | Admitting: Vascular Surgery

## 2013-12-16 DIAGNOSIS — Z48812 Encounter for surgical aftercare following surgery on the circulatory system: Secondary | ICD-10-CM

## 2013-12-16 DIAGNOSIS — I6529 Occlusion and stenosis of unspecified carotid artery: Secondary | ICD-10-CM

## 2014-04-20 ENCOUNTER — Encounter: Payer: Self-pay | Admitting: Family

## 2014-04-21 ENCOUNTER — Ambulatory Visit: Payer: Medicare Other | Admitting: Family

## 2014-04-21 ENCOUNTER — Ambulatory Visit (HOSPITAL_COMMUNITY)
Admission: RE | Admit: 2014-04-21 | Discharge: 2014-04-21 | Disposition: A | Discharge status: Home / Self Care | Payer: Medicare Other | Source: Ambulatory Visit | Attending: Family | Admitting: Family

## 2014-04-21 DIAGNOSIS — I6529 Occlusion and stenosis of unspecified carotid artery: Secondary | ICD-10-CM

## 2014-04-21 DIAGNOSIS — Z48812 Encounter for surgical aftercare following surgery on the circulatory system: Secondary | ICD-10-CM

## 2014-04-26 NOTE — Patient Instructions (Signed)
Dear Ms. Molnar,   Your recent vascular lab study 04-21-14 indicates:   Less than 40%  Right  Internal carotid artery stenosis and  Patent  Left carotid endarterectomy site with no evidence for restenosis. You have improved !  Please follow up with [facility] in one year for CAROTID  Duplex.      Stroke Prevention Some medical conditions and behaviors are associated with an increased chance of having a stroke. You may prevent a stroke by making healthy choices and managing medical conditions. HOW CAN I REDUCE MY RISK OF HAVING A STROKE?   Stay physically active. Get at least 30 minutes of activity on most or all days.  Do not smoke. It may also be helpful to avoid exposure to secondhand smoke.  Limit alcohol use. Moderate alcohol use is considered to be:  No more than 2 drinks per day for men.  No more than 1 drink per day for nonpregnant women.  Eat healthy foods. This involves  Eating 5 or more servings of fruits and vegetables a day.  Following a diet that addresses high blood pressure (hypertension), high cholesterol, diabetes, or obesity.  Manage your cholesterol levels.  A diet low in saturated fat, trans fat, and cholesterol and high in fiber may control cholesterol levels.  Take any prescribed medicines to control cholesterol as directed by your health care provider.  Manage your diabetes.  A controlled-carbohydrate, controlled-sugar diet is recommended to manage diabetes.  Take any prescribed medicines to control diabetes as directed by your health care provider.  Control your hypertension.  A low-salt (sodium), low-saturated fat, low-trans fat, and low-cholesterol diet is recommended to manage hypertension.  Take any prescribed medicines to control hypertension as directed by your health care provider.  Maintain a healthy weight.  A reduced-calorie, low-sodium, low-saturated fat, low-trans fat, low-cholesterol diet is recommended to manage weight.  Stop drug  abuse.  Avoid taking birth control pills.  Talk to your health care provider about the risks of taking birth control pills if you are over 40 years old, smoke, get migraines, or have ever had a blood clot.  Get evaluated for sleep disorders (sleep apnea).  Talk to your health care provider about getting a sleep evaluation if you snore a lot or have excessive sleepiness.  Take medicines as directed by your health care provider.  For some people, aspirin or blood thinners (anticoagulants) are helpful in reducing the risk of forming abnormal blood clots that can lead to stroke. If you have the irregular heart rhythm of atrial fibrillation, you should be on a blood thinner unless there is a good reason you cannot take them.  Understand all your medicine instructions.  Make sure that other other conditions (such as anemia or atherosclerosis) are addressed. SEEK IMMEDIATE MEDICAL CARE IF:   You have sudden weakness or numbness of the face, arm, or leg, especially on one side of the body.  Your face or eyelid droops to one side.  You have sudden confusion.  You have trouble speaking (aphasia) or understanding.  You have sudden trouble seeing in one or both eyes.  You have sudden trouble walking.  You have dizziness.  You have a loss of balance or coordination.  You have a sudden, severe headache with no known cause.  You have new chest pain or an irregular heartbeat. Any of these symptoms may represent a serious problem that is an emergency. Do not wait to see if the symptoms will go away. Get medical  help at once. Call your local emergency services  (911 in [place] Do not drive yourself to the hospital. Document Released: 11/28/2004 Document Revised: 08/11/2013 Document Reviewed: 04/23/2013 [facility] Patient Information 2015 [place], [place]. This information is not intended to replace advice given to you by your health care provider. Make sure you discuss any questions you have with  your health care provider.

## 2014-04-27 ENCOUNTER — Encounter: Payer: Self-pay | Admitting: Surgery

## 2014-04-27 NOTE — Progress Notes (Signed)
Left without being seen.

## 2014-08-04 HISTORY — PX: JOINT REPLACEMENT: SHX530

## 2015-04-28 ENCOUNTER — Encounter: Payer: Self-pay | Admitting: Family

## 2015-05-02 ENCOUNTER — Encounter: Payer: Self-pay | Admitting: Vascular Surgery

## 2015-05-03 ENCOUNTER — Encounter: Payer: Self-pay | Admitting: Family

## 2015-05-03 ENCOUNTER — Ambulatory Visit (HOSPITAL_COMMUNITY)
Admission: RE | Admit: 2015-05-03 | Discharge: 2015-05-03 | Disposition: A | Discharge status: Home / Self Care | Payer: Medicare Other | Source: Ambulatory Visit | Attending: Family | Admitting: Family

## 2015-05-03 ENCOUNTER — Ambulatory Visit (INDEPENDENT_AMBULATORY_CARE_PROVIDER_SITE_OTHER): Payer: Medicare Other | Admitting: Family

## 2015-05-03 VITALS — BP 152/75 | HR 66 | Resp 16 | Ht 65.5 in | Wt 214.0 lb

## 2015-05-03 DIAGNOSIS — Z48812 Encounter for surgical aftercare following surgery on the circulatory system: Secondary | ICD-10-CM

## 2015-05-03 DIAGNOSIS — Z9889 Other specified postprocedural states: Secondary | ICD-10-CM

## 2015-05-03 DIAGNOSIS — I6523 Occlusion and stenosis of bilateral carotid arteries: Secondary | ICD-10-CM

## 2015-05-03 NOTE — Patient Instructions (Signed)
Stroke Prevention Some medical conditions and behaviors are associated with an increased chance of having a stroke. You may prevent a stroke by making healthy choices and managing medical conditions. HOW CAN I REDUCE MY RISK OF HAVING A STROKE?   Stay physically active. Get at least 30 minutes of activity on most or all days.  Do not smoke. It may also be helpful to avoid exposure to secondhand smoke.  Limit alcohol use. Moderate alcohol use is considered to be:  No more than 2 drinks per day for men.  No more than 1 drink per day for nonpregnant women.  Eat healthy foods. This involves:  Eating 5 or more servings of fruits and vegetables a day.  Making dietary changes that address high blood pressure (hypertension), high cholesterol, diabetes, or obesity.  Manage your cholesterol levels.  Making food choices that are high in fiber and low in saturated fat, trans fat, and cholesterol may control cholesterol levels.  Take any prescribed medicines to control cholesterol as directed by your health care provider.  Manage your diabetes.  Controlling your carbohydrate and sugar intake is recommended to manage diabetes.  Take any prescribed medicines to control diabetes as directed by your health care provider.  Control your hypertension.  Making food choices that are low in salt (sodium), saturated fat, trans fat, and cholesterol is recommended to manage hypertension.  Take any prescribed medicines to control hypertension as directed by your health care provider.  Maintain a healthy weight.  Reducing calorie intake and making food choices that are low in sodium, saturated fat, trans fat, and cholesterol are recommended to manage weight.  Stop drug abuse.  Avoid taking birth control pills.  Talk to your health care provider about the risks of taking birth control pills if you are over 35 years old, smoke, get migraines, or have ever had a blood clot.  Get evaluated for sleep  disorders (sleep apnea).  Talk to your health care provider about getting a sleep evaluation if you snore a lot or have excessive sleepiness.  Take medicines only as directed by your health care provider.  For some people, aspirin or blood thinners (anticoagulants) are helpful in reducing the risk of forming abnormal blood clots that can lead to stroke. If you have the irregular heart rhythm of atrial fibrillation, you should be on a blood thinner unless there is a good reason you cannot take them.  Understand all your medicine instructions.  Make sure that other conditions (such as anemia or atherosclerosis) are addressed. SEEK IMMEDIATE MEDICAL CARE IF:   You have sudden weakness or numbness of the face, arm, or leg, especially on one side of the body.  Your face or eyelid droops to one side.  You have sudden confusion.  You have trouble speaking (aphasia) or understanding.  You have sudden trouble seeing in one or both eyes.  You have sudden trouble walking.  You have dizziness.  You have a loss of balance or coordination.  You have a sudden, severe headache with no known cause.  You have new chest pain or an irregular heartbeat. Any of these symptoms may represent a serious problem that is an emergency. Do not wait to see if the symptoms will go away. Get medical help at once. Call your local emergency services (911 in [place] Do not drive yourself to the hospital. Document Released: 11/28/2004 Document Revised: 03/07/2014 Document Reviewed: 04/23/2013 [employer] Patient Information 2015 [employer]. This information is not intended to replace advice given   to you by your health care provider. Make sure you discuss any questions you have with your health care provider.  

## 2015-05-03 NOTE — Progress Notes (Signed)
Established Carotid Patient   History of Present Illness  [name] is a 74 y.o. female patient of Dr. Oneida Alar who is status post left CEA on 07/18/2003.  She thinks she might have had a TIA just prior to the left CEA as manifested as by transient weakness in right arm and mild transient speech difficulties, denies monocular loss of vision. She denies any residual neurological deficits, denies any subsequent TIA's or strokes.   Pt states her blood pressure at home is good, runs 115/60's-70's.  The patient reports New Medical or Surgical History: fractured left hip and had ORIF in October 2015.  She has low back pain since her bone cancer treatment, and takes takes hydrocodone for this.  Pt states she is in remission from the breast cancer and the lesion in her bone has remained stable. 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 She has lymphedema since the right mastectomy with lymph nodes dissected, and wears a compression sleeve on her right arm and hand. She uses stationary bike 20-30 minutes, 7 days/week, in addition to walking in the halls of her apt building daily.  Pt Diabetic: no Pt smoker: non-smoker  Pt meds include: Statin : yes ASA: yes Other anticoagulants/antiplatelets: Plavix   Past Medical History  Diagnosis Date  . Hypertension   . Hyperlipidemia   . Stroke   . Arthritis   . S/P breast lumpectomy   . Peripheral vascular disease   . Lymphedema syndrome, postmastectomy     Right upper extremity  . Cancer oct. 2011    Bone cancer  . Cancer 2008    Breast cancer    Social History History  Substance Use Topics  . Smoking status: Never Smoker   . Smokeless tobacco: Not on file  . Alcohol Use: No    Family History Family History  Problem Relation Age of Onset  . Heart disease Father     prior to age 29  . Heart disease Brother     prior to age 62    Surgical History Past Surgical History  Procedure  Laterality Date  . Carotid endarterectomy  2004    Left CEA    Allergies  Allergen Reactions  . Penicillins     Current Outpatient Prescriptions  Medication Sig Dispense Refill  . aspirin 81 MG tablet Take 81 mg by mouth daily.    . Calcium Carbonate (CALTRATE 600 PO) Take by mouth.    . cholecalciferol (VITAMIN D) 400 UNITS TABS Take by mouth daily.    . clopidogrel (PLAVIX) 75 MG tablet Take 75 mg by mouth daily.    . fish oil-omega-3 fatty acids 1000 MG capsule Take 1 g by mouth daily.    . hydrochlorothiazide (HYDRODIURIL) 25 MG tablet Take 25 mg by mouth daily.    Marland Kitchen HYDROcodone-acetaminophen (LORTAB) 7.5-500 MG per tablet Take 1 tablet by mouth daily.    . pantoprazole (PROTONIX) 40 MG tablet Take 40 mg by mouth daily.    . rosuvastatin (CRESTOR) 20 MG tablet Take 20 mg by mouth daily.     No current facility-administered medications for this visit.    Review of Systems : See HPI for pertinent positives and negatives.  Physical Examination  Filed Vitals:   05/03/15 0921  BP: 152/75  Pulse: 66  Resp: 16  Height: 5' 5.5" (1.664 m)  Weight: 214 lb (97.07 kg)  SpO2: 98%   Body mass index is 35.06 kg/(m^2).  General:  WDWN obese female in NAD GAIT: slight limp Eyes: PERRLA Pulmonary:  Non-labored, CTAB, Negative  Rales, Negative rhonchi, & Negative wheezing.  Cardiac: regular rhythm, no detected murmur.  VASCULAR EXAM Carotid Bruits Right Left   Negative Negative    Aorta is not palpable. Radial pulses are 2+ palpable and equal.                                                                                                                            LE Pulses Right Left       POPLITEAL  not palpable   not palpable       POSTERIOR TIBIAL   palpable   not  palpable        DORSALIS PEDIS      ANTERIOR TIBIAL  palpable   palpable     Gastrointestinal: soft, nontender, BS WNL, no r/g, no palpable masses.  Musculoskeletal: Negative muscle atrophy/wasting.  M/S 5/5 throughout, extremities without ischemic changes. Wearing compression sleeve on right arm.  Neurologic: A&O X 3; Appropriate Affect, Speech is normal CN 2-12 intact, pain and light touch intact in extremities, Motor exam as listed above.   Non-Invasive Vascular Imaging CAROTID DUPLEX 05/03/2015   CEREBROVASCULAR DUPLEX EVALUATION    INDICATION: Carotid endarterectomy     PREVIOUS INTERVENTION(S): Left carotid endarterectomy on 07/18/03    DUPLEX EXAM:     RIGHT  LEFT  Peak Systolic Velocities (cm/s) End Diastolic Velocities (cm/s) Plaque LOCATION Peak Systolic Velocities (cm/s) End Diastolic Velocities (cm/s) Plaque  92 18  CCA PROXIMAL 105 22   91 19  CCA MID 98 30   62 17 HT CCA DISTAL 80 24   79 8  ECA 101 16   99 28 HT ICA PROXIMAL 95 32   87 32  ICA MID 86 29   76 21  ICA DISTAL 76 25     1.6 ICA / CCA Ratio (PSV) Not Calculated  Antegrade Vertebral Flow Antegrade  Mastectomy Brachial Systolic Pressure (mmHg) Not Obtained  Multiphasic (subclavian artery) Brachial Artery Waveforms Multiphasic (subclavian artery)    Plaque Morphology:  HM = Homogeneous, HT = Heterogeneous, CP = Calcific Plaque, SP = Smooth Plaque, IP = Irregular Plaque  ADDITIONAL FINDINGS: No significant stenosis of the bilateral external or common carotid arteries.    IMPRESSION: 1. Patent left carotid endarterectomy site with no left internal carotid artery stenosis. 2. Doppler velocities suggest a 1-39% stenosis of the right proximal internal carotid artery.    Compared to the previous exam:  No significant change noted when compared to the previous exam on 04/21/14.       Assessment: [name] is a 74 y.o. female who is status post left CEA on 07/18/2003. She had a TIA just prior to the left CEA, none subsequent. Today's carotid Duplex suggests a patent left carotid endarterectomy site with no left internal carotid artery stenosis and minimal left ICA stenosis. No significant change  noted  when compared to the previous exam on 04/21/14.    Plan: Follow-up in 1 year with Carotid Duplex.   I discussed in depth with the patient the nature of atherosclerosis, and emphasized the importance of maximal medical management including strict control of blood pressure, blood glucose, and lipid levels, obtaining regular exercise, and continued cessation of smoking.  The patient is aware that without maximal medical management the underlying atherosclerotic disease process will progress, limiting the benefit of any interventions. The patient was given information about stroke prevention and what symptoms should prompt the patient to seek immediate medical care. Thank you for allowing [place] to participate in this patient's care.  Clemon Chambers, RN, MSN, FNP-C [facility] Office: [phone]  Clinic Physician: Scot Dock  05/03/2015 9:17 AM

## 2015-05-04 ENCOUNTER — Ambulatory Visit: Payer: Medicare Other | Admitting: Family

## 2015-05-04 ENCOUNTER — Other Ambulatory Visit (HOSPITAL_COMMUNITY): Payer: Medicare Other

## 2016-04-25 ENCOUNTER — Encounter: Payer: Self-pay | Admitting: Family

## 2016-05-02 ENCOUNTER — Ambulatory Visit (HOSPITAL_COMMUNITY)
Admission: RE | Admit: 2016-05-02 | Discharge: 2016-05-02 | Disposition: A | Discharge status: Home / Self Care | Payer: Medicare Other | Source: Ambulatory Visit | Attending: Family | Admitting: Family

## 2016-05-02 ENCOUNTER — Encounter: Payer: Self-pay | Admitting: Family

## 2016-05-02 ENCOUNTER — Ambulatory Visit (INDEPENDENT_AMBULATORY_CARE_PROVIDER_SITE_OTHER): Payer: Medicare Other | Admitting: Family

## 2016-05-02 VITALS — BP 135/75 | HR 66 | Temp 97.7°F | Resp 16 | Ht 66.0 in | Wt 224.0 lb

## 2016-05-02 DIAGNOSIS — E785 Hyperlipidemia, unspecified: Secondary | ICD-10-CM | POA: Insufficient documentation

## 2016-05-02 DIAGNOSIS — I6523 Occlusion and stenosis of bilateral carotid arteries: Secondary | ICD-10-CM

## 2016-05-02 DIAGNOSIS — Z48812 Encounter for surgical aftercare following surgery on the circulatory system: Secondary | ICD-10-CM | POA: Diagnosis not present

## 2016-05-02 DIAGNOSIS — I6522 Occlusion and stenosis of left carotid artery: Secondary | ICD-10-CM

## 2016-05-02 DIAGNOSIS — Z9889 Other specified postprocedural states: Secondary | ICD-10-CM | POA: Diagnosis present

## 2016-05-02 DIAGNOSIS — I10 Essential (primary) hypertension: Secondary | ICD-10-CM | POA: Diagnosis not present

## 2016-05-02 LAB — VAS US CAROTID
LCCADDIAS: -22 cm/s
LCCADSYS: -77 cm/s
LEFT ECA DIAS: -6 cm/s
LEFT VERTEBRAL DIAS: 22 cm/s
LICADDIAS: -31 cm/s
LICADSYS: -91 cm/s
LICAPDIAS: -26 cm/s
Left CCA prox dias: 27 cm/s
Left CCA prox sys: 121 cm/s
Left ICA prox sys: -80 cm/s
RIGHT CCA MID DIAS: 21 cm/s
RIGHT ECA DIAS: -9 cm/s
RIGHT VERTEBRAL DIAS: -11 cm/s
Right CCA prox dias: 14 cm/s
Right CCA prox sys: 131 cm/s
Right cca dist sys: -107 cm/s

## 2016-05-02 NOTE — Progress Notes (Signed)
Filed Vitals:   05/02/16 1331 05/02/16 1335  BP: 150/69 135/75  Pulse: 66 66  Temp: 97.7 F (36.5 C)   Resp: 16   Height: 5\' 6"  (1.676 m)   Weight: 224 lb (101.606 kg)   SpO2: 96%

## 2016-05-02 NOTE — Patient Instructions (Signed)
Stroke Prevention Some medical conditions and behaviors are associated with an increased chance of having a stroke. You may prevent a stroke by making healthy choices and managing medical conditions. HOW CAN I REDUCE MY RISK OF HAVING A STROKE?   Stay physically active. Get at least 30 minutes of activity on most or all days.  Do not smoke. It may also be helpful to avoid exposure to secondhand smoke.  Limit alcohol use. Moderate alcohol use is considered to be:  No more than 2 drinks per day for men.  No more than 1 drink per day for nonpregnant women.  Eat healthy foods. This involves:  Eating 5 or more servings of fruits and vegetables a day.  Making dietary changes that address high blood pressure (hypertension), high cholesterol, diabetes, or obesity.  Manage your cholesterol levels.  Making food choices that are high in fiber and low in saturated fat, trans fat, and cholesterol may control cholesterol levels.  Take any prescribed medicines to control cholesterol as directed by your health care provider.  Manage your diabetes.  Controlling your carbohydrate and sugar intake is recommended to manage diabetes.  Take any prescribed medicines to control diabetes as directed by your health care provider.  Control your hypertension.  Making food choices that are low in salt (sodium), saturated fat, trans fat, and cholesterol is recommended to manage hypertension.  Ask your health care provider if you need treatment to lower your blood pressure. Take any prescribed medicines to control hypertension as directed by your health care provider.  If you are 18-39 years of age, have your blood pressure checked every 3-5 years. If you are 40 years of age or older, have your blood pressure checked every year.  Maintain a healthy weight.  Reducing calorie intake and making food choices that are low in sodium, saturated fat, trans fat, and cholesterol are recommended to manage  weight.  Stop drug abuse.  Avoid taking birth control pills.  Talk to your health care provider about the risks of taking birth control pills if you are over 35 years old, smoke, get migraines, or have ever had a blood clot.  Get evaluated for sleep disorders (sleep apnea).  Talk to your health care provider about getting a sleep evaluation if you snore a lot or have excessive sleepiness.  Take medicines only as directed by your health care provider.  For some people, aspirin or blood thinners (anticoagulants) are helpful in reducing the risk of forming abnormal blood clots that can lead to stroke. If you have the irregular heart rhythm of atrial fibrillation, you should be on a blood thinner unless there is a good reason you cannot take them.  Understand all your medicine instructions.  Make sure that other conditions (such as anemia or atherosclerosis) are addressed. SEEK IMMEDIATE MEDICAL CARE IF:   You have sudden weakness or numbness of the face, arm, or leg, especially on one side of the body.  Your face or eyelid droops to one side.  You have sudden confusion.  You have trouble speaking (aphasia) or understanding.  You have sudden trouble seeing in one or both eyes.  You have sudden trouble walking.  You have dizziness.  You have a loss of balance or coordination.  You have a sudden, severe headache with no known cause.  You have new chest pain or an irregular heartbeat. Any of these symptoms may represent a serious problem that is an emergency. Do not wait to see if the symptoms will   go away. Get medical help at once. Call your local emergency services (911 in [place] Do not drive yourself to the hospital.   This information is not intended to replace advice given to you by your health care provider. Make sure you discuss any questions you have with your health care provider.   Document Released: 11/28/2004 Document Revised: 11/11/2014 Document Reviewed:  04/23/2013 Elsevier Interactive Patient Education 2016 Elsevier Inc.  

## 2016-05-02 NOTE — Progress Notes (Signed)
Chief Complaint: Follow up Extracranial Carotid Artery Stenosis   History of Present Illness  [name] is a 75 y.o. female patient of Dr. Oneida Alar who is status post left CEA on 07/18/2003.  She thinks she might have had a TIA just prior to the left CEA as manifested as by transient weakness in right arm and mild transient speech difficulties, denies monocular loss of vision. She denies any residual neurological deficits, denies any subsequent TIA's or strokes.   Pt states her blood pressure at home is good, runs 115/60's-70's.  The patient reports New Medical or Surgical History: none. She fractured left hip and had ORIF in October 2015.  She has low back pain since her bone cancer treatment, and takes takes hydrocodone for this.  Pt states she is in remission from the breast cancer and the lesion in her bone has remained stable. 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 She has lymphedema since the right mastectomy with lymph nodes dissected, and wears a compression sleeve on her right arm and hand. She uses stationary bike 20-30 minutes, 3 days/week, in addition to walking in the halls of her apt building daily 3-4x/day.  Pt Diabetic: no Pt smoker: non-smoker  Pt meds include: Statin : yes ASA: yes Other anticoagulants/antiplatelets: Plavix    Past Medical History  Diagnosis Date  . Hypertension   . Hyperlipidemia   . Stroke ([place])   . Arthritis   . S/P breast lumpectomy   . Peripheral vascular disease ([place])   . Lymphedema syndrome, postmastectomy     Right upper extremity  . Cancer ([place]) oct. 2011    Bone cancer  . Cancer [facility]) 2008    Breast cancer    Social History Social History  Substance Use Topics  . Smoking status: Never Smoker   . Smokeless tobacco: Never Used  . Alcohol Use: No    Family History Family History  Problem Relation Age of Onset  . Heart disease Father     prior to age 64  . Heart  attack Father   . Heart disease Brother     prior to age 80  . Cancer Mother     Stomach  . Hypertension Brother     Surgical History Past Surgical History  Procedure Laterality Date  . Carotid endarterectomy  2004    Left CEA  . Joint replacement Left Oct. 1, 2015    Hip  . Cholecystectomy  2009    Gall Bladder    Allergies  Allergen Reactions  . Tape Rash    Blisters and painful ...the patient can use PAPER TAPE ONLY.  Marland Kitchen Penicillins Other (See Comments)    It was a long time ago- not sure of the reaction, but they told her not to take it anymore.     Current Outpatient Prescriptions  Medication Sig Dispense Refill  . aspirin 81 MG tablet Take 81 mg by mouth daily.    . Calcium Carbonate (CALTRATE 600 PO) Take by mouth.    . cholecalciferol (VITAMIN D) 400 UNITS TABS Take by mouth daily.    . clopidogrel (PLAVIX) 75 MG tablet Take 75 mg by mouth daily.    . fish oil-omega-3 fatty acids 1000 MG capsule Take 1 g by mouth daily.    . hydrochlorothiazide (HYDRODIURIL) 25 MG tablet Take 25 mg by mouth daily.    Marland Kitchen HYDROcodone-acetaminophen (LORTAB) 7.5-500 MG per tablet Take 1 tablet by mouth daily.    Marland Kitchen  Krill Oil 300 MG CAPS Take 1 capsule by mouth.    . pantoprazole (PROTONIX) 40 MG tablet Take 40 mg by mouth daily.    Vladimir Faster Glycol-Propyl Glycol 0.4-0.3 % SOLN 1 drop.    . potassium chloride SA (K-DUR,KLOR-CON) 20 MEQ tablet Take 20 mEq by mouth.    . rosuvastatin (CRESTOR) 20 MG tablet Take 20 mg by mouth daily.    . rosuvastatin (CRESTOR) 40 MG tablet TAKE ONE-HALF TABLET BY MOUTH EVERY DAY    . sertraline (ZOLOFT) 50 MG tablet   0  . sertraline (ZOLOFT) 50 MG tablet Take 50 mg by mouth.     No current facility-administered medications for this visit.    Review of Systems : See HPI for pertinent positives and negatives.  Physical Examination  Filed Vitals:   05/02/16 1331 05/02/16 1335  BP: 150/69 135/75  Pulse: 66 66  Temp: 97.7 F (36.5 C)   Resp: 16    Height: 5\' 6"  (1.676 m)   Weight: 224 lb (101.606 kg)   SpO2: 96%    Body mass index is 36.17 kg/(m^2).  General: WDWN obese female in NAD GAIT: slight limp Eyes: PERRLA Pulmonary: Respirations are non-labored, CTAB Cardiac: regular rhythm, no detected murmur.  VASCULAR EXAM Carotid Bruits Right Left   Negative Negative   Aorta is not palpable. Radial pulses are 2+ palpable and equal.      LE Pulses Right Left   POPLITEAL not palpable  not palpable   POSTERIOR TIBIAL  not palpable   palpable    DORSALIS PEDIS  ANTERIOR TIBIAL not palpable  palpable     Gastrointestinal: soft, mildly tender to palpation, BS WNL, no r/g, no palpable masses.  Musculoskeletal: No muscle atrophy/wasting. M/S 5/5 throughout, extremities without ischemic changes. Wearing compression sleeve on right arm and hand.  Neurologic: A&O X 3; Appropriate Affect, Speech is normal CN 2-12 intact, pain and light touch intact in extremities, Motor exam as listed above.         Non-Invasive Vascular Imaging CAROTID DUPLEX 05/02/2016   CEREBROVASCULAR DUPLEX EVALUATION    INDICATION: Carotid artery disease    PREVIOUS INTERVENTION(S): Left carotid endarterectomy 07/18/2003    DUPLEX EXAM: Carotid duplex    RIGHT  LEFT  Peak Systolic Velocities (cm/s) End Diastolic Velocities (cm/s) Plaque LOCATION Peak Systolic Velocities (cm/s) End Diastolic Velocities (cm/s) Plaque  131 14  CCA PROXIMAL 121 27   104 21  CCA MID 102 30   80 19 HT CCA DISTAL 77 22   98 9  ECA 59 6   188 33 HT ICA PROXIMAL 80 26 HT  88 28  ICA MID 96 31   107 38  ICA DISTAL 91 31     1.1 ICA / CCA Ratio (PSV) NA  Antegrade Vertebral Flow Antegrade  Masectomy Brachial Systolic Pressure (mmHg) -  - Brachial Artery Waveforms -    Plaque Morphology:  HM =  Homogeneous, HT = Heterogeneous, CP = Calcific Plaque, SP = Smooth Plaque, IP = Irregular Plaque     ADDITIONAL FINDINGS: Multiphasic subclavian arteries    IMPRESSION: 1. Less than 40% right internal carotid artery stenosis 2. Patent left carotid endarterectomy site with less than 40%  left internal carotid artery stenosis.    Compared to the previous exam:  No change since exam of 05/03/2015       Assessment: [name] is a 75 y.o. female who is status post left CEA on 07/18/2003. She had  a TIA just prior to the left CEA, none subsequently.  Today's carotid Duplex suggests less than 40% right internal carotid artery stenosis and left carotid endarterectomy site with less than 40%  left internal carotid artery stenosis. No change since exam of 05/03/2015.  She takes a statin, ASA, and Plavix.  Plan: Follow-up in 1 year with Carotid Duplex scan.   I discussed in depth with the patient the nature of atherosclerosis, and emphasized the importance of maximal medical management including strict control of blood pressure, blood glucose, and lipid levels, obtaining regular exercise, and continued cessation of smoking.  The patient is aware that without maximal medical management the underlying atherosclerotic disease process will progress, limiting the benefit of any interventions. The patient was given information about stroke prevention and what symptoms should prompt the patient to seek immediate medical care. Thank you for allowing [place] to participate in this patient's care.  Clemon Chambers, RN, MSN, FNP-C [facility] Office: [facility] Physician: Oneida Alar  05/02/2016 1:39 PM

## 2017-05-08 ENCOUNTER — Ambulatory Visit: Payer: Medicare Other | Admitting: Family

## 2017-05-08 ENCOUNTER — Encounter (HOSPITAL_COMMUNITY): Payer: Medicare Other

## 2017-06-13 ENCOUNTER — Other Ambulatory Visit: Payer: Self-pay

## 2017-06-13 DIAGNOSIS — I6522 Occlusion and stenosis of left carotid artery: Secondary | ICD-10-CM

## 2017-06-19 ENCOUNTER — Encounter: Payer: Self-pay | Admitting: Family

## 2017-06-19 ENCOUNTER — Ambulatory Visit (HOSPITAL_COMMUNITY)
Admission: RE | Admit: 2017-06-19 | Discharge: 2017-06-19 | Disposition: A | Discharge status: Home / Self Care | Payer: Medicare Other | Source: Ambulatory Visit | Attending: Family | Admitting: Family

## 2017-06-19 ENCOUNTER — Ambulatory Visit (INDEPENDENT_AMBULATORY_CARE_PROVIDER_SITE_OTHER): Payer: Medicare Other | Admitting: Family

## 2017-06-19 VITALS — BP 151/86 | HR 70 | Temp 97.4°F | Resp 18 | Ht 66.0 in | Wt 225.0 lb

## 2017-06-19 DIAGNOSIS — Z48812 Encounter for surgical aftercare following surgery on the circulatory system: Secondary | ICD-10-CM | POA: Diagnosis not present

## 2017-06-19 DIAGNOSIS — Z9889 Other specified postprocedural states: Secondary | ICD-10-CM

## 2017-06-19 DIAGNOSIS — I6522 Occlusion and stenosis of left carotid artery: Secondary | ICD-10-CM | POA: Diagnosis not present

## 2017-06-19 LAB — VAS US CAROTID
LEFT ECA DIAS: -6 cm/s
LEFT VERTEBRAL DIAS: -17 cm/s
LICAPDIAS: -21 cm/s
LICAPSYS: -72 cm/s
Left CCA dist dias: 25 cm/s
Left CCA dist sys: 81 cm/s
Left CCA prox dias: 19 cm/s
Left CCA prox sys: 93 cm/s
Left ICA dist dias: -28 cm/s
Left ICA dist sys: -88 cm/s
RIGHT CCA MID DIAS: 13 cm/s
RIGHT ECA DIAS: -2 cm/s
RIGHT VERTEBRAL DIAS: -11 cm/s
Right CCA prox dias: 18 cm/s
Right CCA prox sys: 113 cm/s
Right cca dist sys: -79 cm/s

## 2017-06-19 NOTE — Patient Instructions (Signed)
Stroke Prevention Some medical conditions and behaviors are associated with an increased chance of having a stroke. You may prevent a stroke by making healthy choices and managing medical conditions. How can I reduce my risk of having a stroke?  Stay physically active. Get at least 30 minutes of activity on most or all days.  Do not smoke. It may also be helpful to avoid exposure to secondhand smoke.  Limit alcohol use. Moderate alcohol use is considered to be:  No more than 2 drinks per day for men.  No more than 1 drink per day for nonpregnant women.  Eat healthy foods. This involves:  Eating 5 or more servings of fruits and vegetables a day.  Making dietary changes that address high blood pressure (hypertension), high cholesterol, diabetes, or obesity.  Manage your cholesterol levels.  Making food choices that are high in fiber and low in saturated fat, trans fat, and cholesterol may control cholesterol levels.  Take any prescribed medicines to control cholesterol as directed by your health care provider.  Manage your diabetes.  Controlling your carbohydrate and sugar intake is recommended to manage diabetes.  Take any prescribed medicines to control diabetes as directed by your health care provider.  Control your hypertension.  Making food choices that are low in salt (sodium), saturated fat, trans fat, and cholesterol is recommended to manage hypertension.  Ask your health care provider if you need treatment to lower your blood pressure. Take any prescribed medicines to control hypertension as directed by your health care provider.  If you are 18-39 years of age, have your blood pressure checked every 3-5 years. If you are 40 years of age or older, have your blood pressure checked every year.  Maintain a healthy weight.  Reducing calorie intake and making food choices that are low in sodium, saturated fat, trans fat, and cholesterol are recommended to manage  weight.  Stop drug abuse.  Avoid taking birth control pills.  Talk to your health care provider about the risks of taking birth control pills if you are over 35 years old, smoke, get migraines, or have ever had a blood clot.  Get evaluated for sleep disorders (sleep apnea).  Talk to your health care provider about getting a sleep evaluation if you snore a lot or have excessive sleepiness.  Take medicines only as directed by your health care provider.  For some people, aspirin or blood thinners (anticoagulants) are helpful in reducing the risk of forming abnormal blood clots that can lead to stroke. If you have the irregular heart rhythm of atrial fibrillation, you should be on a blood thinner unless there is a good reason you cannot take them.  Understand all your medicine instructions.  Make sure that other conditions (such as anemia or atherosclerosis) are addressed. Get help right away if:  You have sudden weakness or numbness of the face, arm, or leg, especially on one side of the body.  Your face or eyelid droops to one side.  You have sudden confusion.  You have trouble speaking (aphasia) or understanding.  You have sudden trouble seeing in one or both eyes.  You have sudden trouble walking.  You have dizziness.  You have a loss of balance or coordination.  You have a sudden, severe headache with no known cause.  You have new chest pain or an irregular heartbeat. Any of these symptoms may represent a serious problem that is an emergency. Do not wait to see if the symptoms will go away.   Get medical help at once. Call your local emergency services (911 in [place] Do not drive yourself to the hospital. This information is not intended to replace advice given to you by your health care provider. Make sure you discuss any questions you have with your health care provider. Document Released: 11/28/2004 Document Revised: 03/28/2016 Document Reviewed: 04/23/2013 Elsevier  Interactive Patient Education  2017 [employer]  

## 2017-06-19 NOTE — Progress Notes (Signed)
Chief Complaint: Follow up Extracranial Carotid Artery Stenosis   History of Present Illness  [name] is a 76 y.o. female who is status post left CEA on 07/18/2003 by Dr. Oneida Alar.  She thinks she might have had a TIA just prior to the left CEA as manifested as by transient weakness in right arm and mild transient speech difficulties, denies monocular loss of vision. She denies any residual neurological deficits, denies any subsequent TIA's or strokes.   Husband states her blood pressure at home is good, runs 110-120/60's-70's.   She fractured left hip and had ORIF in October 2015.  Pt states she is in remission from the breast cancer and the lesion in her bone has remained stable. 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 Husband states she can take care of herself.  She has lymphedema since the right mastectomy with lymph nodes dissected, and wears a compression sleeve on her right arm and hand. She walks with a walker  in the halls of her apt building 2x/day.  Pt Diabetic: no Pt smoker: non-smoker  Pt meds include: Statin : yes ASA: yes Other anticoagulants/antiplatelets: Plavix    Past Medical History:  Diagnosis Date  . Arthritis   . Cancer ([place]) oct. 2011   Bone cancer  . Cancer [facility]) 2008   Breast cancer  . Hyperlipidemia   . Hypertension   . Lymphedema syndrome, postmastectomy    Right upper extremity  . Peripheral vascular disease ([place])   . S/P breast lumpectomy   . Stroke [facility])     Social History Social History  Substance Use Topics  . Smoking status: Never Smoker  . Smokeless tobacco: Never Used  . Alcohol use No    Family History Family History  Problem Relation Age of Onset  . Heart disease Father        prior to age 40  . Heart attack Father   . Heart disease Brother        prior to age 31  . Cancer Mother        Stomach  . Hypertension Brother     Surgical History Past Surgical  History:  Procedure Laterality Date  . CAROTID ENDARTERECTOMY  2004   Left CEA  . CHOLECYSTECTOMY  2009   Gall Bladder  . JOINT REPLACEMENT Left Oct. 1, 2015   Hip    Allergies  Allergen Reactions  . Tape Rash    Blisters and painful ...the patient can use PAPER TAPE ONLY.  Marland Kitchen Penicillins Other (See Comments)    It was a long time ago- not sure of the reaction, but they told her not to take it anymore.     Current Outpatient Prescriptions  Medication Sig Dispense Refill  . aspirin 81 MG tablet Take 81 mg by mouth daily.    . Calcium Carbonate (CALTRATE 600 PO) Take by mouth.    . cholecalciferol (VITAMIN D) 400 UNITS TABS Take by mouth daily.    . clopidogrel (PLAVIX) 75 MG tablet Take 75 mg by mouth daily.    . hydrochlorothiazide (HYDRODIURIL) 25 MG tablet Take 25 mg by mouth daily.    Marland Kitchen HYDROcodone-acetaminophen (LORTAB) 7.5-500 MG per tablet Take 1 tablet by mouth daily.    . pantoprazole (PROTONIX) 40 MG tablet Take 40 mg by mouth daily.    Vladimir Faster Glycol-Propyl Glycol 0.4-0.3 % SOLN 1 drop.    . potassium chloride SA (K-DUR,KLOR-CON) 20 MEQ tablet Take  20 mEq by mouth.    . rosuvastatin (CRESTOR) 20 MG tablet Take 20 mg by mouth daily.    . rosuvastatin (CRESTOR) 40 MG tablet TAKE ONE-HALF TABLET BY MOUTH EVERY DAY    . sertraline (ZOLOFT) 50 MG tablet   0  . fish oil-omega-3 fatty acids 1000 MG capsule Take 1 g by mouth daily.    Javier Docker Oil 300 MG CAPS Take 1 capsule by mouth.    . sertraline (ZOLOFT) 50 MG tablet Take 50 mg by mouth.     No current facility-administered medications for this visit.     Review of Systems : See HPI for pertinent positives and negatives.  Physical Examination  Vitals:   06/19/17 1540  BP: (!) 151/86  Pulse: 70  Resp: 18  Temp: (!) 97.4 F (36.3 C)  TempSrc: Oral  SpO2: 99%  Weight: 225 lb (102.1 kg)  Height: 5\' 6"  (1.676 m)   Body mass index is 36.32 kg/m.  General: WDWN obese female in NAD GAIT: slight limp Eyes:  PERRLA Pulmonary: Respirations are non-labored, CTAB Cardiac: regular rhythm and rate, no detected murmur.  VASCULAR EXAM Carotid Bruits Right Left   Negative Negative    Abdominal aortic pulse is not palpable. Radial pulses are 1+ palpable and equal.      LE Pulses Right Left   POPLITEAL not palpable  not palpable   POSTERIOR TIBIAL  palpable   palpable    DORSALIS PEDIS  ANTERIOR TIBIAL not palpable  palpable     Gastrointestinal: soft, mildly tender to palpation, BS WNL, no r/g, no palpable masses.  Musculoskeletal: No muscle atrophy/wasting. M/S 5/5 throughout, extremities without ischemic changes. Wearing compression sleeve on right arm and hand, lymphedema.  Neurologic: A&O X 2; Appropriate Affect, speech is normal, CN 2-12 intact, pain and light touch intact in extremities, Motor exam as listed above    Assessment: [name] is a 76 y.o. female who is status post left CEA on 07/18/2003. She had a TIA just prior to the left CEA, none subsequently.  She takes a statin, ASA, and Plavix.   DATA Carotid Duplex (06/19/17): 1-39% right internal carotid artery stenosis. Left carotid endarterectomy site with 1-39%  left internal carotid artery stenosis. Bilateral vertebral artery flow is antegrade.  Bilateral subclavian artery waveforms are normal.  No significant change since exams of 05/03/2015 and 05/02/16.   Plan: Follow-up in 18 months with Carotid Duplex scan.   I discussed in depth with the patient the nature of atherosclerosis, and emphasized the importance of maximal medical management including strict control of blood pressure, blood glucose, and lipid levels, obtaining regular exercise, and continued cessation of smoking.  The patient is aware that without maximal medical  management the underlying atherosclerotic disease process will progress, limiting the benefit of any interventions. The patient was given information about stroke prevention and what symptoms should prompt the patient to seek immediate medical care. Thank you for allowing [place] to participate in this patient's care.  Clemon Chambers, RN, MSN, FNP-C [facility] Office: [facility] Physician: Oneida Alar  06/19/17 3:59 PM

## 2017-06-23 NOTE — Addendum Note (Signed)
Addended by: Lianne Cure A on: 06/23/2017 04:04 PM   Modules accepted: Orders

## 2018-12-24 ENCOUNTER — Ambulatory Visit: Payer: Medicare Other | Admitting: Family

## 2018-12-24 ENCOUNTER — Encounter (HOSPITAL_COMMUNITY): Payer: Medicare Other

## 2022-03-04 DEATH — deceased
